# Patient Record
Sex: Male | Born: 1995 | Race: White | Hispanic: No | Marital: Single | Smoking: Never smoker
Health system: Southern US, Community
[De-identification: ages and names within clinical notes are randomized; demographics above are authoritative.]

## PROBLEM LIST (undated history)

## (undated) DIAGNOSIS — K219 Gastro-esophageal reflux disease without esophagitis: Secondary | ICD-10-CM

## (undated) DIAGNOSIS — J45909 Unspecified asthma, uncomplicated: Secondary | ICD-10-CM

## (undated) DIAGNOSIS — T7840XA Allergy, unspecified, initial encounter: Secondary | ICD-10-CM

## (undated) HISTORY — DX: Unspecified asthma, uncomplicated: J45.909

## (undated) HISTORY — DX: Allergy, unspecified, initial encounter: T78.40XA

## (undated) HISTORY — DX: Gastro-esophageal reflux disease without esophagitis: K21.9

---

## 2013-08-21 ENCOUNTER — Other Ambulatory Visit: Payer: Self-pay | Admitting: Physician Assistant

## 2013-08-21 DIAGNOSIS — E010 Iodine-deficiency related diffuse (endemic) goiter: Secondary | ICD-10-CM

## 2013-08-23 ENCOUNTER — Ambulatory Visit
Admission: RE | Admit: 2013-08-23 | Discharge: 2013-08-23 | Disposition: A | Discharge status: Home / Self Care | Payer: 59 | Source: Ambulatory Visit | Attending: Physician Assistant | Admitting: Physician Assistant

## 2013-08-23 ENCOUNTER — Encounter (INDEPENDENT_AMBULATORY_CARE_PROVIDER_SITE_OTHER): Payer: Self-pay

## 2013-08-23 DIAGNOSIS — E010 Iodine-deficiency related diffuse (endemic) goiter: Secondary | ICD-10-CM

## 2013-11-21 ENCOUNTER — Ambulatory Visit (INDEPENDENT_AMBULATORY_CARE_PROVIDER_SITE_OTHER): Payer: 59

## 2013-11-21 ENCOUNTER — Ambulatory Visit (INDEPENDENT_AMBULATORY_CARE_PROVIDER_SITE_OTHER): Payer: 59 | Admitting: Family Medicine

## 2013-11-21 VITALS — BP 118/72 | HR 63 | Temp 98.5°F | Resp 16 | Ht 76.0 in | Wt 217.8 lb

## 2013-11-21 DIAGNOSIS — M25571 Pain in right ankle and joints of right foot: Secondary | ICD-10-CM

## 2013-11-21 DIAGNOSIS — M25471 Effusion, right ankle: Secondary | ICD-10-CM

## 2013-11-21 DIAGNOSIS — T148XXA Other injury of unspecified body region, initial encounter: Secondary | ICD-10-CM

## 2013-11-21 DIAGNOSIS — T148 Other injury of unspecified body region: Secondary | ICD-10-CM

## 2013-11-21 NOTE — Patient Instructions (Addendum)

## 2013-11-21 NOTE — Progress Notes (Signed)
Chief Complaint:  Chief Complaint  Patient presents with  . Ankle Pain    right ankle pain and some swelling---x 3 weeks    HPI: Ryan Singleton is a 18 y.o. male who is here for  3 week hx of right ankle pain, he work with [employer], is up and down ladders, right ankle hurting x 3 week, more on the side. He has a rolling ankle but no prior fx or dislocations. .He has put a compression sock on it and did not help,he has elevated itt. The swelling is larger in the morning. Compressing it at night. He sits in recliners at home. No history of gout. Same boots for last  7 months ago without any issues. He has not tried any meds.   Past Medical History  Diagnosis Date  . Allergy   . Asthma   . GERD (gastroesophageal reflux disease)    No past surgical history on file. History   Social History  . Marital Status: Single    Spouse Name: N/A    Number of Children: N/A  . Years of Education: N/A   Social History Main Topics  . Smoking status: Never Smoker   . Smokeless tobacco: Never Used  . Alcohol Use: No  . Drug Use: No  . Sexual Activity: None   Other Topics Concern  . None   Social History Narrative  . None   Family History  Problem Relation Age of Onset  . Hyperlipidemia Mother   . Hyperlipidemia Father   . Hypertension Father   . Diabetes Father   . Mental retardation Sister   . Stroke Maternal Grandmother   . Cancer Maternal Grandmother   . Heart disease Maternal Grandfather   . Hyperlipidemia Maternal Grandfather   . Hypertension Maternal Grandfather   . Hypertension Paternal Grandmother   . Heart disease Paternal Grandmother   . Diabetes Paternal Grandmother   . Cancer Paternal Grandfather   . Diabetes Paternal Grandfather    Allergies  Allergen Reactions  . Penicillins     hives  . Sulfa Antibiotics     Vomiting    Prior to Admission medications   Not on File     ROS: The patient denies fevers, chills, night sweats,  unintentional weight loss, chest pain, palpitations, wheezing, dyspnea on exertion, nausea, vomiting, abdominal pain, dysuria, hematuria, melena, numbness, weakness, or tingling.  All other systems have been reviewed and were otherwise negative with the exception of those mentioned in the HPI and as above.    PHYSICAL EXAM: Filed Vitals:   11/21/13 2007  BP: 118/72  Pulse: 63  Temp: 98.5 F (36.9 C)  Resp: 16   Filed Vitals:   11/21/13 2007  Height: 6\' 4"  (1.93 m)  Weight: 217 lb 12.8 oz (98.793 kg)   Body mass index is 26.52 kg/(m^2).  General: Alert, no acute distress HEENT:  Normocephalic, atraumatic, oropharynx patent. EOMI, PERRLA Cardiovascular:  Regular rate and rhythm, no rubs murmurs or gallops.  No Carotid bruits, radial pulse intact. No pedal edema.  Respiratory: Clear to auscultation bilaterally.  No wheezes, rales, or rhonchi.  No cyanosis, no use of accessory musculature GI: No organomegaly, abdomen is soft and non-tender, positive bowel sounds.  No masses. Skin: No rashes. Neurologic: Facial musculature symmetric. Psychiatric: Patient is appropriate throughout our interaction. Lymphatic: No cervical lymphadenopathy Musculoskeletal: Gait intact. Right ankle-+ lateral malleolus swelling, full ROM, neg anterior Drawer, + tenderness anterior calcaneofibular ligametn There is  no laxity in ROM although there is full ROM.  5/5 strength, sensation intact, + DP   LABS: No results found for this or any previous visit.   EKG/XRAY:   [facility]. Neg for fx or dislocation   ASSESSMENT/PLAN: Encounter Diagnoses  Name Primary?  . Acute right ankle pain Yes  . Swelling of ankle joint, right   . Sprain and strain    Sweedo RICE ROM exercises, otc meds for pain F/u prn   Gross sideeffects, risk and benefits, and alternatives of medications d/w patient. Patient is aware that all medications have potential sideeffects and we are  unable to predict every sideeffect or drug-drug interaction that may occur.  Hamilton CapriLE, Brayley Mackowiak PHUONG, DO 11/21/2013 9:27 PM

## 2013-11-28 ENCOUNTER — Ambulatory Visit (INDEPENDENT_AMBULATORY_CARE_PROVIDER_SITE_OTHER): Payer: 59 | Admitting: Emergency Medicine

## 2013-11-28 VITALS — BP 122/76 | HR 78 | Temp 99.4°F | Resp 17 | Ht 76.5 in | Wt 221.0 lb

## 2013-11-28 DIAGNOSIS — J020 Streptococcal pharyngitis: Secondary | ICD-10-CM

## 2013-11-28 MED ORDER — CLARITHROMYCIN 250 MG PO TABS: 250.0000 mg | ORAL_TABLET | Freq: Two times a day (BID) | ORAL | Status: AC

## 2013-11-28 NOTE — Progress Notes (Signed)
[facility] [address], [place] [address] [phone]  Date:  11/28/2013   Name:  Ryan Singleton   DOB:  November 12, 1995   MRN:  [id number]  PCP:  No PCP Per Patient    Chief Complaint: Sore Throat   History of Present Illness:  Ryan Singleton is a 18 y.o. very pleasant male patient who presents with the following:  Ill two days with sore throat and swollen tonsils.  Fever. No chills  Some nasal congestion some drainage Non productive cough.  No wheezing or shortness of breath. No nausea or vomiting No improvement with over the counter medications or other home remedies.  Denies other complaint or health concern today.    There are no active problems to display for this patient.   Past Medical History  Diagnosis Date  . Allergy   . Asthma   . GERD (gastroesophageal reflux disease)     No past surgical history on file.  History  Substance Use Topics  . Smoking status: Never Smoker   . Smokeless tobacco: Never Used  . Alcohol Use: No    Family History  Problem Relation Age of Onset  . Hyperlipidemia Mother   . Hyperlipidemia Father   . Hypertension Father   . Diabetes Father   . Mental retardation Sister   . Stroke Maternal Grandmother   . Cancer Maternal Grandmother   . Heart disease Maternal Grandfather   . Hyperlipidemia Maternal Grandfather   . Hypertension Maternal Grandfather   . Hypertension Paternal Grandmother   . Heart disease Paternal Grandmother   . Diabetes Paternal Grandmother   . Cancer Paternal Grandfather   . Diabetes Paternal Grandfather     Allergies  Allergen Reactions  . Penicillins     hives  . Sulfa Antibiotics     Vomiting     Medication list has been reviewed and updated.  No current outpatient prescriptions on file prior to visit.   No current facility-administered medications on file prior to visit.    Review of Systems:  As per HPI, otherwise negative.    Physical  Examination: Filed Vitals:   11/28/13 1859  BP: 122/76  Pulse: 78  Temp: 99.4 F (37.4 C)  Resp: 17   Filed Vitals:   11/28/13 1859  Height: 6' 4.5" (1.943 m)  Weight: 221 lb (100.245 kg)   Body mass index is 26.55 kg/(m^2). Ideal Body Weight: Weight in (lb) to have BMI = 25: 207.7  GEN: WDWN, NAD, Non-toxic, A & O x 3 HEENT: Atraumatic, Normocephalic. Neck supple. No masses, No LAD.  Tonsils large and red no exudate Ears and Nose: No external deformity. CV: RRR, No M/G/R. No JVD. No thrill. No extra heart sounds. PULM: CTA B, no wheezes, crackles, rhonchi. No retractions. No resp. distress. No accessory muscle use. ABD: S, NT, ND, +BS. No rebound. No HSM. EXTR: No c/c/e NEURO Normal gait.  PSYCH: Normally interactive. Conversant. Not depressed or anxious appearing.  Calm demeanor.    Assessment and Plan: Tonsillitis Tylenol biaxin  Signed,  Phillips OdorJeffery Yves Fodor, MD

## 2013-11-28 NOTE — Patient Instructions (Signed)

## 2015-09-26 IMAGING — CR DG ANKLE COMPLETE 3+V*R*
3 series · 3 of 3 positions shown · non-contrast
Comparison: None.

CLINICAL DATA: Bilateral ankle pain for several weeks.

EXAM:
RIGHT ANKLE - COMPLETE 3+ VIEW

[AP]
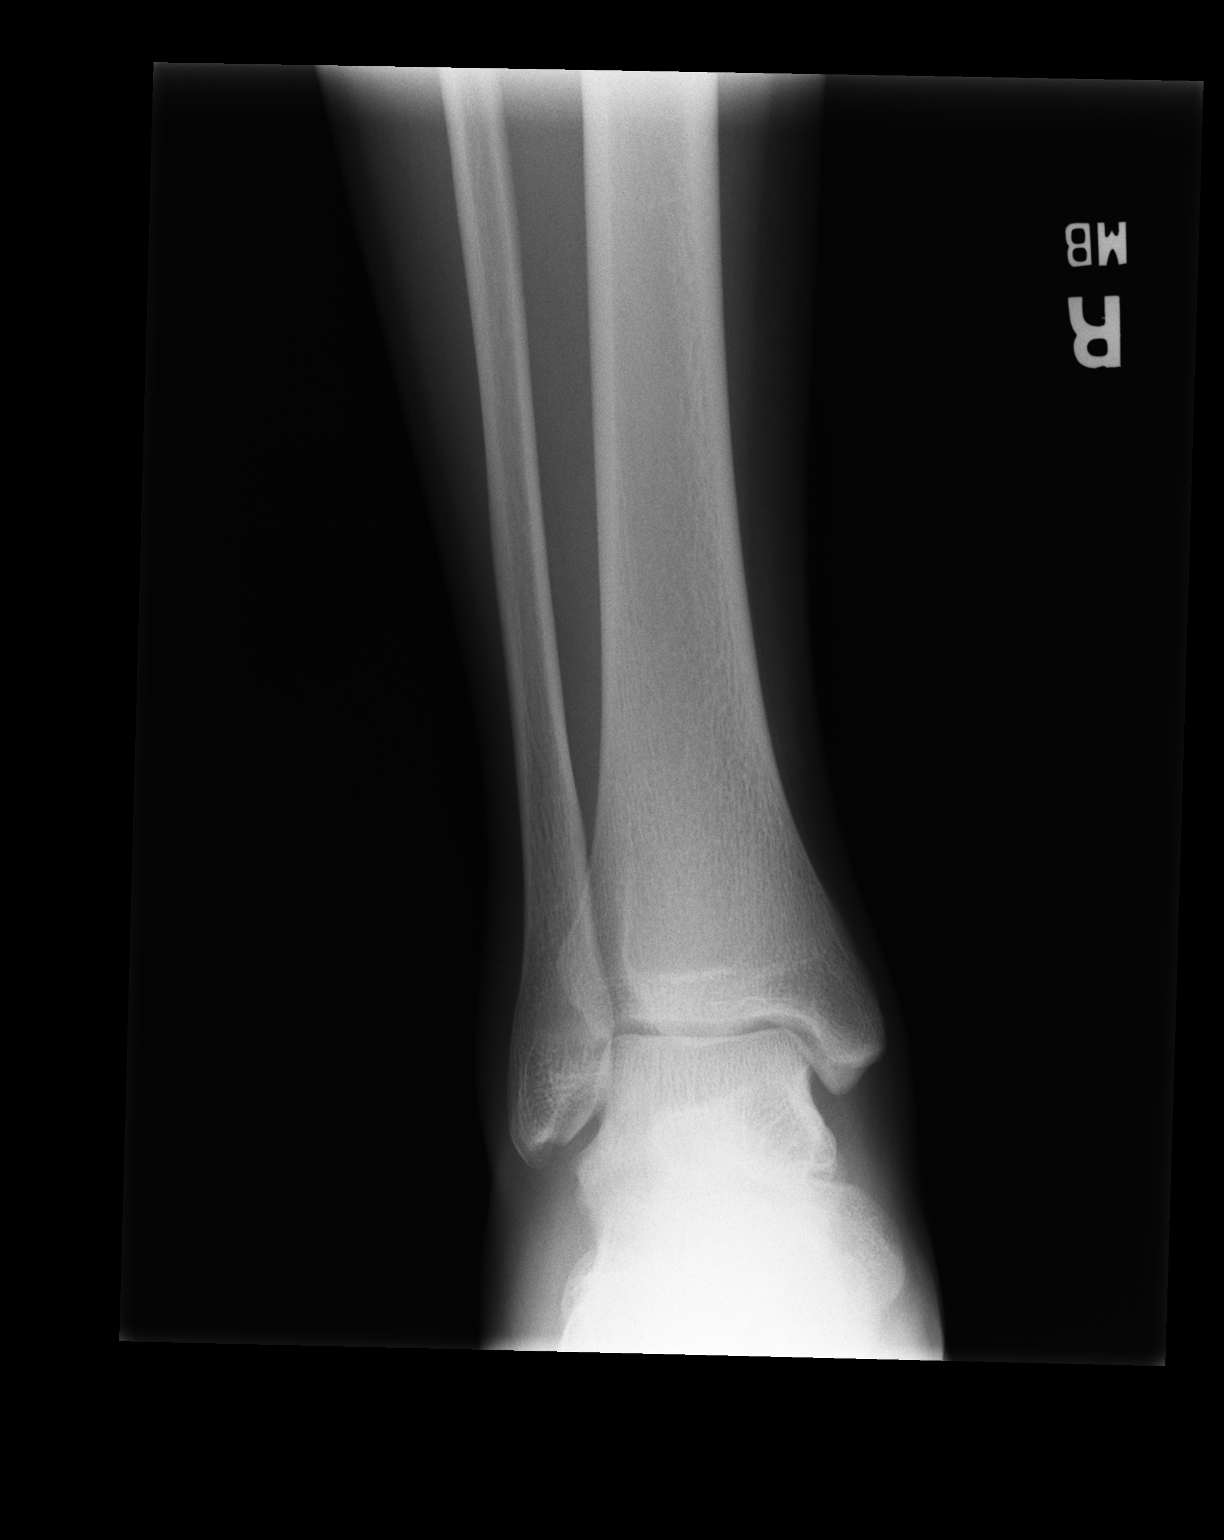

[ap obl int rot]
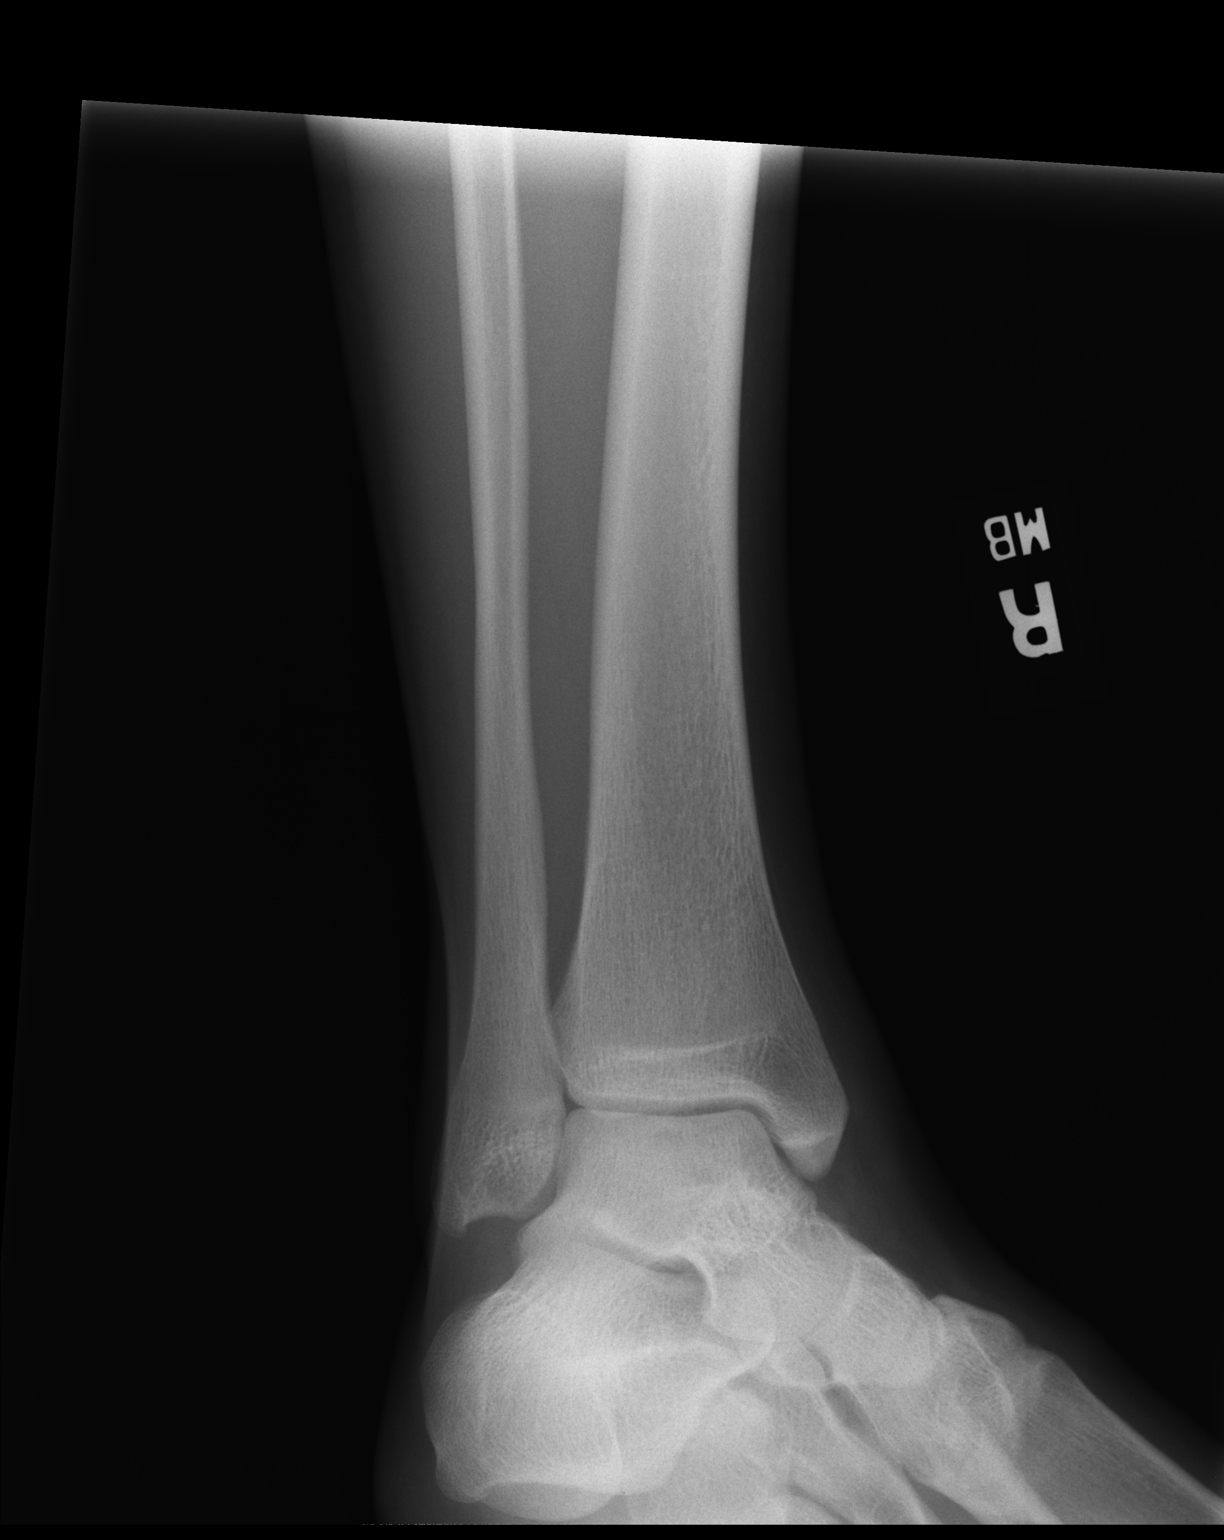

[lateral]
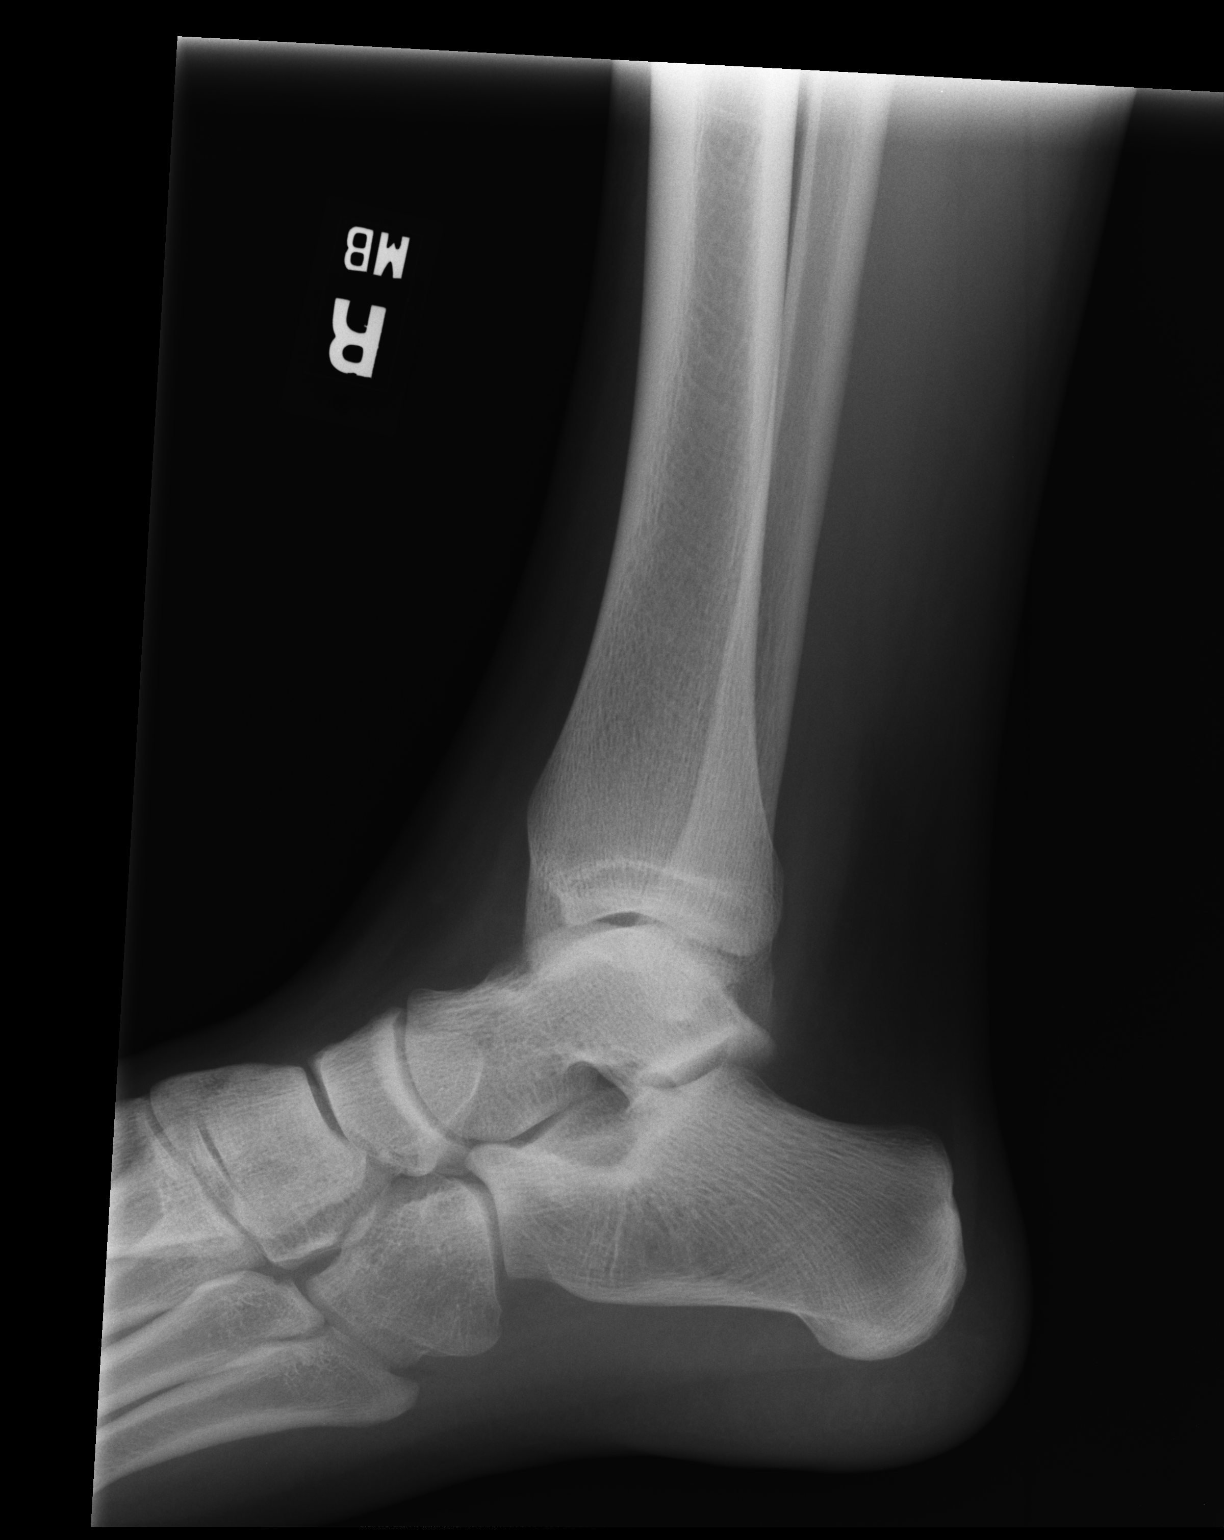

[3 of 3 positions shown; findings below may reference images not displayed]

FINDINGS: There is no evidence of fracture, dislocation, or joint effusion.
There is no evidence of arthropathy or other focal bone abnormality.
Soft tissues are unremarkable.
IMPRESSION: Negative.

## 2016-07-29 IMAGING — US THYROID
1 series · 14 of 25 positions shown · non-contrast
Comparison: None.

CLINICAL DATA: Thyromegaly

EXAM:
THYROID ULTRASOUND
TECHNIQUE: Ultrasound examination of the thyroid gland and adjacent soft
tissues was performed.

[Series 1: thyroid · 0.07mm/px · 14 of 53 slices shown]
[im 1/53]
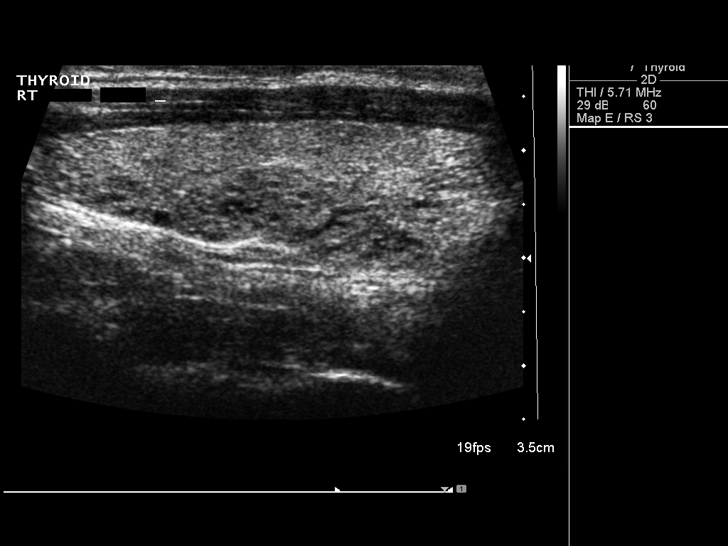
[im 5/53]
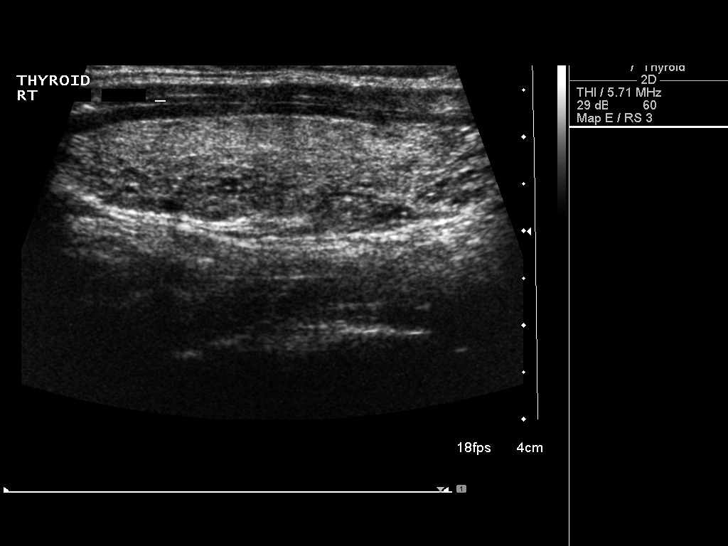
[im 9/53]
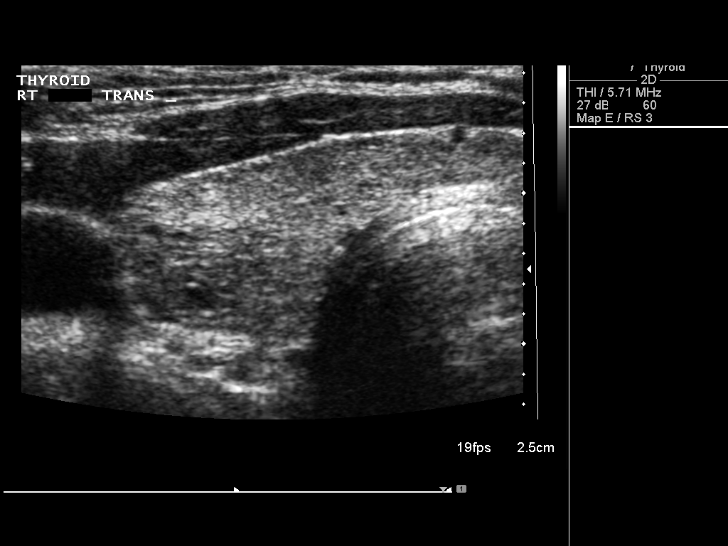
[im 14/53]
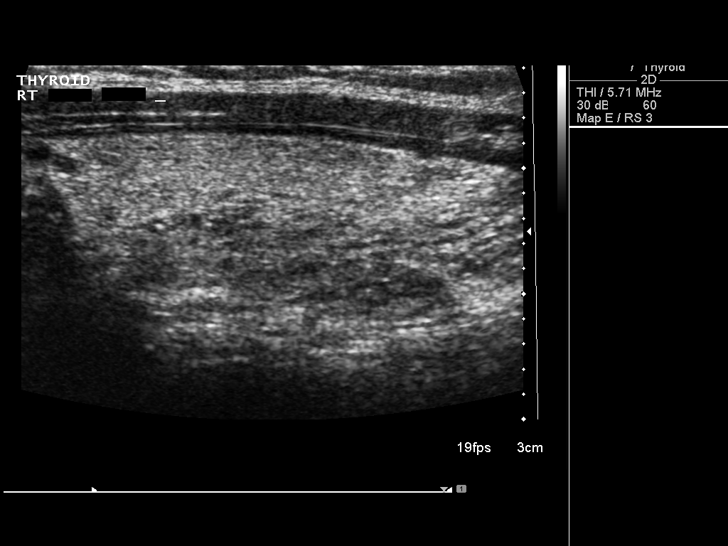
[im 18/53]
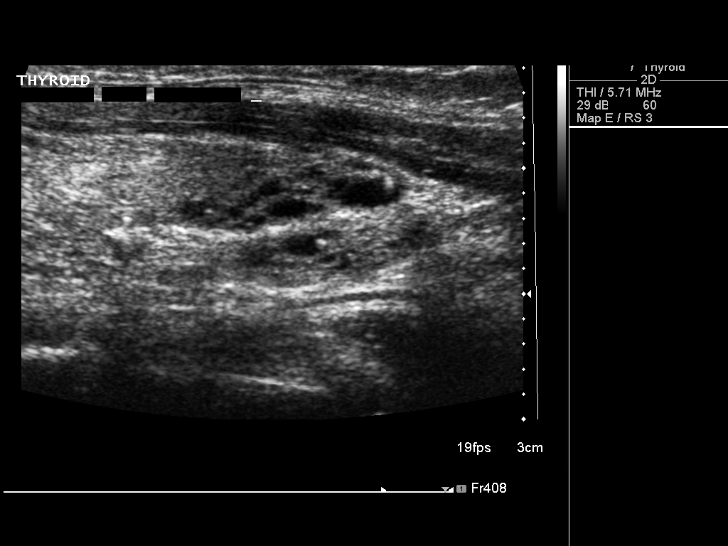
[im 20/53]
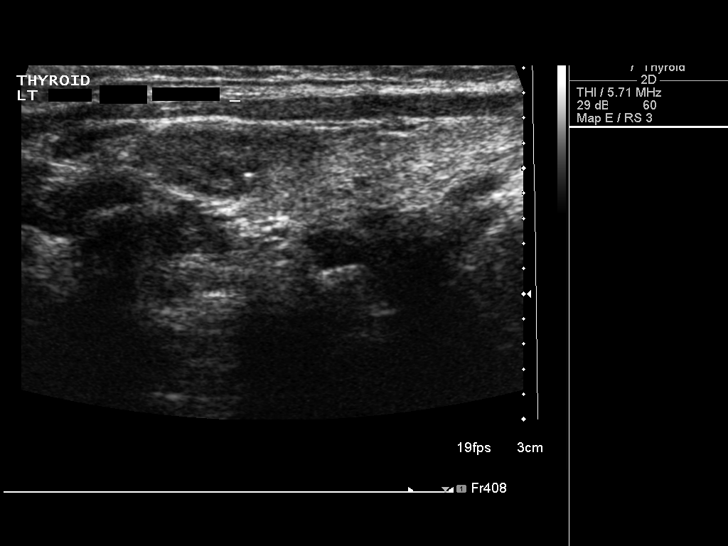
[im 24/53]
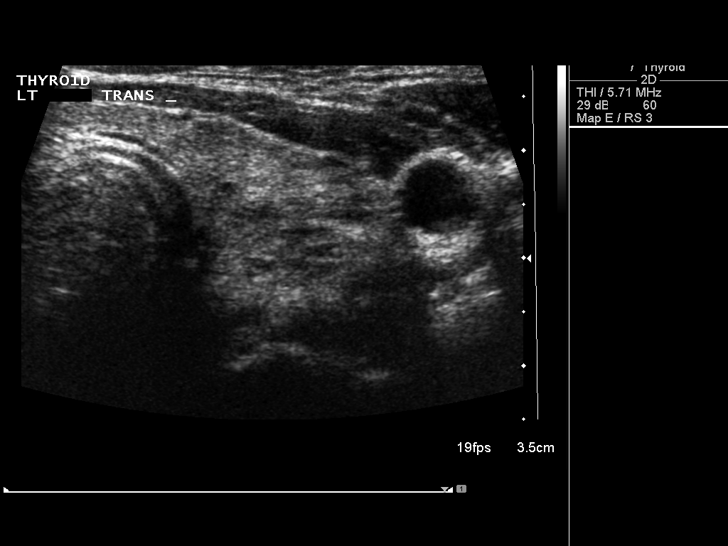
[im 29/53]
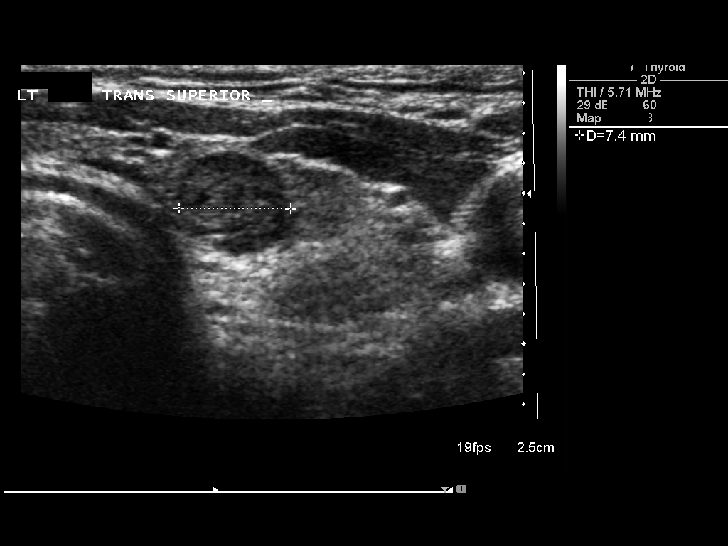
[im 33/53]
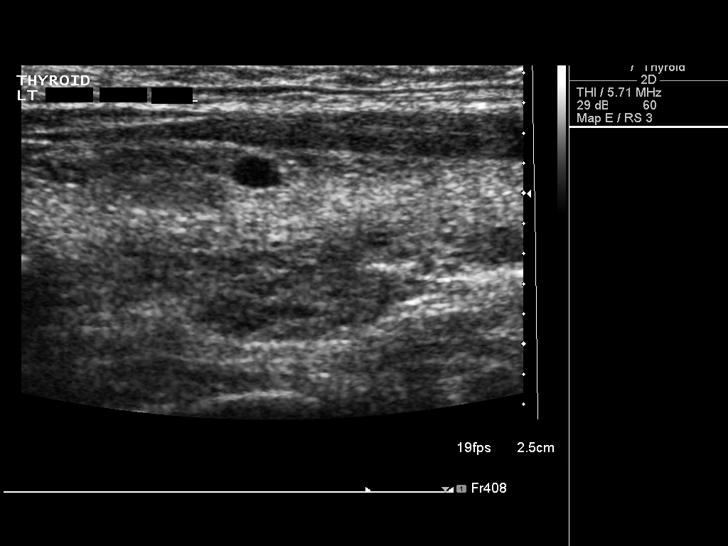
[im 35/53]
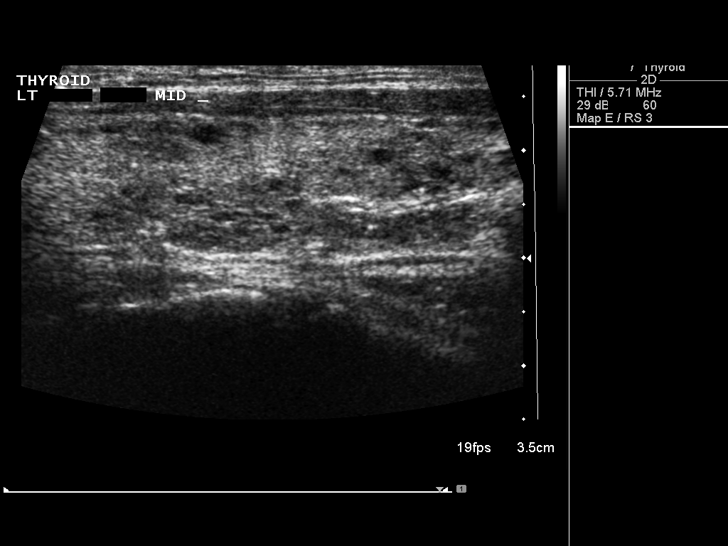
[im 40/53]
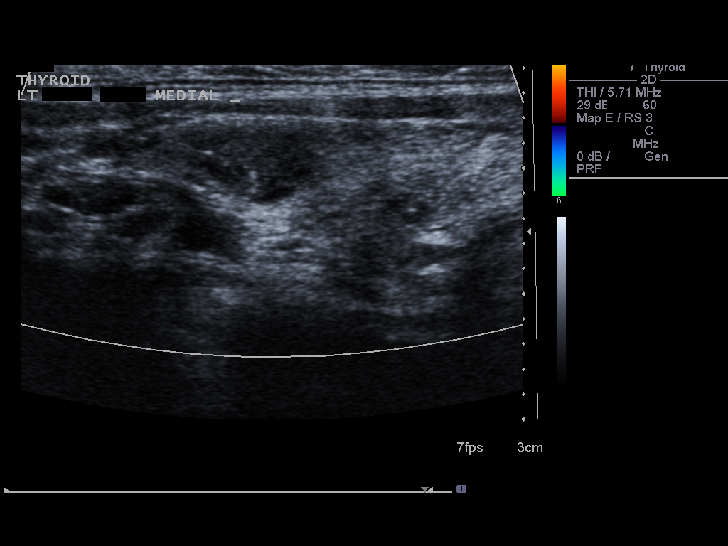
[im 44/53]
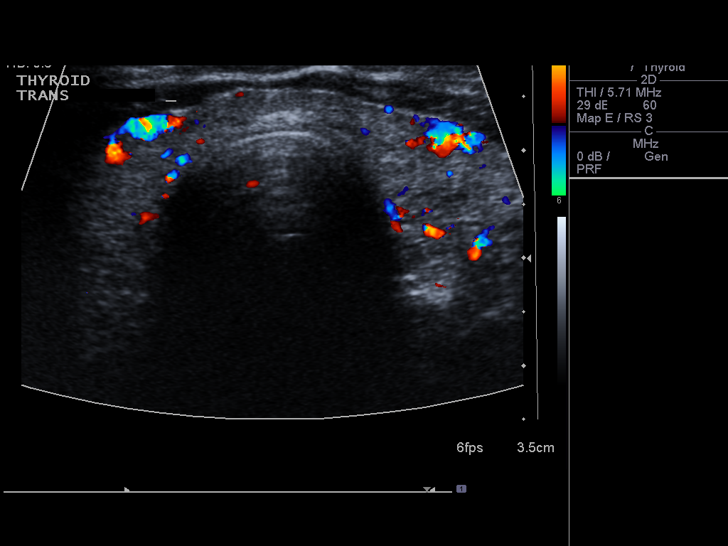
[im 48/53]
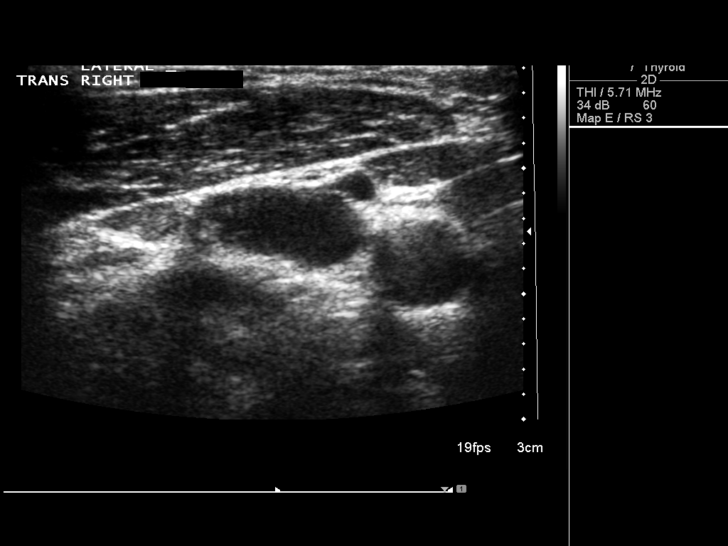
[im 53/53]
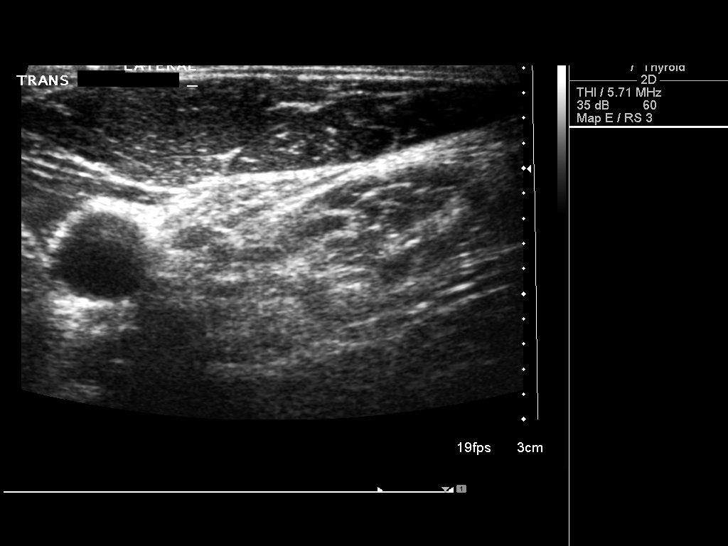

[14 of 25 positions shown; findings below may reference images not displayed]

FINDINGS: Right thyroid lobe

Measurements: 4.8 x 1.2 x 1.7 cm. Heterogeneous glandular tissue
without mass or nodule.

Left thyroid lobe

Measurements: 6.1 x 1.4 x 1.8 cm. 1.0 x 0.7 x 0.7 cm complex nodule
in the upper pole. 3 mm adjacent cysts. Glandular tissue is
heterogeneous.

Isthmus

Thickness: 2 mm.  No nodules visualized.

Lymphadenopathy

None visualized.
IMPRESSION: 1.0 cm complex nodule in the upper pole of the left lobe and an
adjacent cyst. Heterogeneous tissue. Findings do not meet current
SRU consensus criteria for biopsy. Follow-up by clinical exam is
recommended. If patient has known risk factors for thyroid
carcinoma, consider follow-up ultrasound in 12 months. If patient is
clinically hyperthyroid, consider nuclear medicine thyroid uptake
and scan.Reference: Management of Thyroid Nodules Detected at US:
Society of Radiologists in Ultrasound Consensus Conference

## 2016-10-31 ENCOUNTER — Ambulatory Visit
Admission: EM | Admit: 2016-10-31 | Discharge: 2016-10-31 | Disposition: A | Discharge status: Home / Self Care | Payer: BLUE CROSS/BLUE SHIELD | Attending: Family Medicine | Admitting: Family Medicine

## 2016-10-31 ENCOUNTER — Encounter: Payer: Self-pay | Admitting: *Deleted

## 2016-10-31 DIAGNOSIS — J40 Bronchitis, not specified as acute or chronic: Secondary | ICD-10-CM | POA: Diagnosis not present

## 2016-10-31 DIAGNOSIS — R05 Cough: Secondary | ICD-10-CM

## 2016-10-31 DIAGNOSIS — R059 Cough, unspecified: Secondary | ICD-10-CM

## 2016-10-31 MED ORDER — AZITHROMYCIN 250 MG PO TABS: ORAL_TABLET | ORAL | 0 refills | Status: AC

## 2016-10-31 MED ORDER — BENZONATATE 100 MG PO CAPS: 100.0000 mg | ORAL_CAPSULE | Freq: Three times a day (TID) | ORAL | 0 refills | Status: AC | PRN

## 2016-10-31 MED ORDER — PREDNISONE 20 MG PO TABS: 40.0000 mg | ORAL_TABLET | Freq: Every day | ORAL | 0 refills | Status: AC

## 2016-10-31 NOTE — Discharge Instructions (Signed)
Take medication as prescribed. Rest. Drink plenty of fluids.  ° °Follow up with your primary care physician this week as needed. Return to Urgent care for new or worsening concerns.  ° °

## 2016-10-31 NOTE — ED Provider Notes (Signed)
[facility] ____________________________________________  Time seen: Approximately 10:02 AM  I have reviewed the triage vital signs and the nursing notes.   HISTORY  Chief Complaint Cough   HPI Ryan Singleton is a 21 y.o. male past medical history of seasonal allergies, asthma and acid reflux presenting for evaluation of 3-4 weeks of cough and congestion. Patient reports initially symptoms started off with what he believes was a cold, as multiple coworkers with similar. States initially he had runny nose, nasal congestion, sore throat with the cough. States all her symptoms have resolved but now continues with the cough. States cough is most likely worse at night. States he has not been taking anything at home for the cough. Denies any hearing of wheezing or shortness of breath. Denies hemoptysis. States cough is nonproductive hacking cough. Patient states that he primarily came in today because his parents wanted him to make sure he was fine. Denies any recent asthma exacerbation or pneumonia infection, states this was initiated as a young child. Reports continues to eat and drink well. Reports has continued to remain active. Denies aggravating or alleviating factors. States has albuterol inhaler at home but has not used it recently. Denies chest pain, shortness of breath, abdominal pain, dysuria, extremity pain, extremity swelling or rash. Denies recent sickness. Denies recent antibiotic use.    Past Medical History:  Diagnosis Date  . Allergy   . Asthma   . GERD (gastroesophageal reflux disease)     There are no active problems to display for this patient.   History reviewed. No pertinent surgical history.   No current facility-administered medications for this encounter.   Current Outpatient Prescriptions:  .  azithromycin (ZITHROMAX Z-PAK) 250 MG tablet, Take 2 tablets (500 mg) on  Day 1,  followed by 1 tablet (250 mg) once daily on Days 2 through 5., Disp: 6  each, Rfl: 0 .  benzonatate (TESSALON PERLES) 100 MG capsule, Take 1 capsule (100 mg total) by mouth 3 (three) times daily as needed for cough., Disp: 15 capsule, Rfl: 0 .  clarithromycin (BIAXIN) 250 MG tablet, Take 1 tablet (250 mg total) by mouth 2 (two) times daily., Disp: 20 tablet, Rfl: 0 .  predniSONE (DELTASONE) 20 MG tablet, Take 2 tablets (40 mg total) by mouth daily., Disp: 10 tablet, Rfl: 0  Allergies Penicillins and Sulfa antibiotics  Family History  Problem Relation Age of Onset  . Hyperlipidemia Mother   . Hyperlipidemia Father   . Hypertension Father   . Diabetes Father   . Mental retardation Sister   . Stroke Maternal Grandmother   . Cancer Maternal Grandmother   . Heart disease Maternal Grandfather   . Hyperlipidemia Maternal Grandfather   . Hypertension Maternal Grandfather   . Hypertension Paternal Grandmother   . Heart disease Paternal Grandmother   . Diabetes Paternal Grandmother   . Cancer Paternal Grandfather   . Diabetes Paternal Grandfather     Social History Social History  Substance Use Topics  . Smoking status: Never Smoker  . Smokeless tobacco: Never Used  . Alcohol use No    Review of Systems Constitutional: No fever/chills ENT: No sore throat. Cardiovascular: Denies chest pain. Respiratory: Denies shortness of breath. Gastrointestinal: No abdominal pain.  No nausea, no vomiting.   Musculoskeletal: Negative for back pain. Skin: Negative for rash.   ____________________________________________   PHYSICAL EXAM:  VITAL SIGNS: ED Triage Vitals  Enc Vitals Group     BP 10/31/16 0940 123/89  Pulse Rate 10/31/16 0940 68     Resp 10/31/16 0940 16     Temp 10/31/16 0940 98.6 F (37 C)     Temp Source 10/31/16 0940 Oral     SpO2 10/31/16 0940 100 %     Weight 10/31/16 0943 210 lb (95.3 kg)     Height 10/31/16 0943  (1.93 m)     Head Circumference --      Peak Flow --      Pain Score --      Pain Loc --      Pain Edu? --        Excl. in GC? --    Constitutional: Alert and oriented. Well appearing and in no acute distress. Eyes: Conjunctivae are normal.  Head: Atraumatic. No sinus tenderness to palpation. No swelling. No erythema.  Ears: no erythema, normal TMs bilaterally.   Nose:No nasal congestion.   Mouth/Throat: Mucous membranes are moist. No pharyngeal erythema. No tonsillar swelling or exudate.  Neck: No stridor.  No cervical spine tenderness to palpation. Hematological/Lymphatic/Immunilogical: No cervical lymphadenopathy. Cardiovascular: Normal rate, regular rhythm. Grossly normal heart sounds.  Good peripheral circulation. Respiratory: Normal respiratory effort.  No retractions. No wheezes, rales or rhonchi. Good air movement.  Intermittent cough noted in room with bronchopsam wheeze.  Gastrointestinal: Soft and nontender. Musculoskeletal: Ambulatory with steady gait. No cervical, thoracic or lumbar tenderness to palpation. Neurologic:  Normal speech and language. No gait instability. Skin:  Skin appears warm, dry and intact. No rash noted. Psychiatric: Mood and affect are normal. Speech and behavior are normal.  ___________________________________________   LABS (all labs ordered are listed, but only abnormal results are displayed)  Labs Reviewed - No data to display  PROCEDURES Procedures   INITIAL IMPRESSION / ASSESSMENT AND PLAN / ED COURSE  Pertinent labs & imaging results that were available during my care of the patient were reviewed by me and considered in my medical decision making (see chart for details).  Well-appearing patient. No acute distress. Lungs clear throughout, mild bronchospasm noted with coughing. Suspect recent viral URI, now continued with cough due to bronchospasm. Not clear asthma exacerbation. Discussed with patient encouraged home albuterol inhaler use, prednisone and when necessary Tessalon Perles. Encouraged this supportive care. Discussed Rx hard copy given for  azithromycin if symptoms persist past 3 more days with supportive care. Encourage reevaluation for continued complaints or worsening concerns.Discussed indication, risks and benefits of medications with patient.  Discussed follow up with Primary care physician this week. Discussed follow up and return parameters including no resolution or any worsening concerns. Patient verbalized understanding and agreed to plan.   ____________________________________________   FINAL CLINICAL IMPRESSION(S) / ED DIAGNOSES  Final diagnoses:  Bronchitis  Cough     Discharge Medication List as of 10/31/2016 10:11 AM    START taking these medications   Details  azithromycin (ZITHROMAX Z-PAK) 250 MG tablet Take 2 tablets (500 mg) on  Day 1,  followed by 1 tablet (250 mg) once daily on Days 2 through 5., Print    benzonatate (TESSALON PERLES) 100 MG capsule Take 1 capsule (100 mg total) by mouth 3 (three) times daily as needed for cough., Starting Sun 10/31/2016, Normal    predniSONE (DELTASONE) 20 MG tablet Take 2 tablets (40 mg total) by mouth daily., Starting Sun 10/31/2016, Normal        Note: This dictation was prepared with Dragon dictation along with smaller phrase technology. Any transcriptional errors that result from this process  are unintentional.         Renford Dills, NP 10/31/16 1226

## 2016-10-31 NOTE — ED Triage Notes (Signed)
Non-productive cough x1 month. Cough began with a cold which resolved but cough persists. Denies other symptoms at this time.

## 2020-12-15 ENCOUNTER — Ambulatory Visit
Admission: EM | Admit: 2020-12-15 | Discharge: 2020-12-15 | Disposition: A | Discharge status: Home / Self Care | Payer: BC Managed Care – PPO

## 2020-12-15 ENCOUNTER — Other Ambulatory Visit: Payer: Self-pay

## 2020-12-15 DIAGNOSIS — J039 Acute tonsillitis, unspecified: Secondary | ICD-10-CM

## 2020-12-15 DIAGNOSIS — J069 Acute upper respiratory infection, unspecified: Secondary | ICD-10-CM

## 2020-12-15 NOTE — ED Triage Notes (Signed)
Pt c/o sore throat, cough up phelgm with blood.  Took 2 pills of z pack yesterday. None today.

## 2020-12-15 NOTE — Discharge Instructions (Addendum)
Finish the zpack til gone.

## 2020-12-15 NOTE — ED Provider Notes (Signed)
[facility]    CSN: [id number] Arrival date & time: 12/15/20  1017      History   Chief Complaint Chief Complaint  Patient presents with   Sore Throat    HPI Ryan Singleton is a 25 y.o. male who presents with ST and cough with slight tinge of blood yesterday but none since. He felt like when he has strep, so took the first dose of a Zpack he had at home and now feels fine. But his wife still wanted him to come in to be seen. Had a temp of 99.8 2 days ago.   Past Medical History:  Diagnosis Date   Allergy    Asthma    GERD (gastroesophageal reflux disease)     There are no problems to display for this patient.   No past surgical history on file.   Home Medications    Prior to Admission medications   Medication Sig Start Date End Date Taking? Authorizing Provider  azithromycin (ZITHROMAX Z-PAK) 250 MG tablet Take 2 tablets (500 mg) on  Day 1,  followed by 1 tablet (250 mg) once daily on Days 2 through 5. 10/31/16   Renford Dills, NP  benzonatate (TESSALON PERLES) 100 MG capsule Take 1 capsule (100 mg total) by mouth 3 (three) times daily as needed for cough. 10/31/16   Renford Dills, NP  clarithromycin (BIAXIN) 250 MG tablet Take 1 tablet (250 mg total) by mouth 2 (two) times daily. 11/28/13   Carmelina Dane, MD  predniSONE (DELTASONE) 20 MG tablet Take 2 tablets (40 mg total) by mouth daily. 10/31/16   Renford Dills, NP    Family History Family History  Problem Relation Age of Onset   Hyperlipidemia Mother    Hyperlipidemia Father    Hypertension Father    Diabetes Father    Mental retardation Sister    Stroke Maternal Grandmother    Cancer Maternal Grandmother    Heart disease Maternal Grandfather    Hyperlipidemia Maternal Grandfather    Hypertension Maternal Grandfather    Hypertension Paternal Grandmother    Heart disease Paternal Grandmother    Diabetes Paternal Grandmother    Cancer Paternal Grandfather    Diabetes Paternal  Grandfather     Social History Social History   Tobacco Use   Smoking status: Never   Smokeless tobacco: Never  Substance Use Topics   Alcohol use: No   Drug use: No     Allergies   Penicillins and Sulfa antibiotics   Review of Systems Review of Systems  Constitutional:  Positive for chills, fatigue and fever. Negative for activity change, appetite change and diaphoresis.  HENT:  Positive for postnasal drip and sore throat. Negative for ear discharge, ear pain and trouble swallowing.   Eyes:  Negative for discharge.  Respiratory:  Positive for cough.   Musculoskeletal:  Negative for myalgias.  Skin:  Negative for rash.  Neurological:  Negative for headaches.    Physical Exam Triage Vital Signs ED Triage Vitals  Enc Vitals Group     BP 12/15/20 1059 140/90     Pulse Rate 12/15/20 1059 72     Resp 12/15/20 1059 16     Temp 12/15/20 1059 98.3 F (36.8 C)     Temp Source 12/15/20 1059 Oral     SpO2 12/15/20 1059 100 %     Weight 12/15/20 1058 210 lb 1.6 oz (95.3 kg)     Height 12/15/20 1058 6\' 4"  (1.93 m)  Head Circumference --      Peak Flow --      Pain Score 12/15/20 1057 0     Pain Loc --      Pain Edu? --      Excl. in GC? --    No data found.  Updated Vital Signs BP 140/90 (BP Location: Left Arm)   Pulse 72   Temp 98.3 F (36.8 C) (Oral)   Resp 16   Ht 6\' 4"  (1.93 m)   Wt 210 lb 1.6 oz (95.3 kg)   SpO2 100%   BMI 25.57 kg/m   Visual Acuity Right Eye Distance:   Left Eye Distance:   Bilateral Distance:    Right Eye Near:   Left Eye Near:    Bilateral Near:     Physical Exam Vitals and nursing note reviewed.  Constitutional:      General: He is not in acute distress.    Appearance: He is normal weight. He is not toxic-appearing.  HENT:     Head: Normocephalic.     Mouth/Throat:     Mouth: Mucous membranes are moist. No oral lesions.     Pharynx: Uvula midline. Posterior oropharyngeal erythema present. No oropharyngeal exudate.      Tonsils: No tonsillar exudate. 2+ on the right. 2+ on the left.  Eyes:     Conjunctiva/sclera: Conjunctivae normal.     Pupils: Pupils are equal, round, and reactive to light.  Cardiovascular:     Rate and Rhythm: Normal rate and regular rhythm.     Heart sounds: No murmur heard. Pulmonary:     Effort: Pulmonary effort is normal.     Breath sounds: Normal breath sounds.  Musculoskeletal:     Cervical back: Neck supple.  Skin:    General: Skin is warm and dry.  Neurological:     Mental Status: He is alert and oriented to person, place, and time.  Psychiatric:        Mood and Affect: Mood normal.        Behavior: Behavior normal.     UC Treatments / Results  Labs (all labs ordered are listed, but only abnormal results are displayed) Labs Reviewed - No data to display  EKG   Radiology No results found.  Procedures Procedures (including critical care time)  Medications Ordered in UC Medications - No data to display  Initial Impression / Assessment and Plan / UC Course  I have reviewed the triage vital signs and the nursing notes. Tonsillitis I advised him to finish the Zpack.  Final Clinical Impressions(s) / UC Diagnoses   Final diagnoses:  Acute tonsillitis, unspecified etiology  Acute upper respiratory infection     Discharge Instructions      Finish the zpack til gone.      ED Prescriptions   None    PDMP not reviewed this encounter.   , Garey Ham 12/15/20 1129

## 2022-11-01 ENCOUNTER — Ambulatory Visit
Admission: EM | Admit: 2022-11-01 | Discharge: 2022-11-01 | Disposition: A | Discharge status: Home / Self Care | Payer: BC Managed Care – PPO | Attending: Physician Assistant | Admitting: Physician Assistant

## 2022-11-01 DIAGNOSIS — R051 Acute cough: Secondary | ICD-10-CM

## 2022-11-01 DIAGNOSIS — J069 Acute upper respiratory infection, unspecified: Secondary | ICD-10-CM | POA: Diagnosis present

## 2022-11-01 DIAGNOSIS — Z1152 Encounter for screening for COVID-19: Secondary | ICD-10-CM | POA: Diagnosis not present

## 2022-11-01 DIAGNOSIS — B9789 Other viral agents as the cause of diseases classified elsewhere: Secondary | ICD-10-CM | POA: Insufficient documentation

## 2022-11-01 DIAGNOSIS — R0981 Nasal congestion: Secondary | ICD-10-CM | POA: Diagnosis not present

## 2022-11-01 DIAGNOSIS — J029 Acute pharyngitis, unspecified: Secondary | ICD-10-CM

## 2022-11-01 LAB — SARS CORONAVIRUS 2 BY RT PCR: SARS Coronavirus 2 by RT PCR: NEGATIVE

## 2022-11-01 LAB — GROUP A STREP BY PCR: Group A Strep by PCR: NOT DETECTED

## 2022-11-01 MED ORDER — PROMETHAZINE-DM 6.25-15 MG/5ML PO SYRP: 5.0000 mL | ORAL_SOLUTION | Freq: Four times a day (QID) | ORAL | 0 refills | Status: AC | PRN

## 2022-11-01 NOTE — ED Triage Notes (Signed)
Patient states that he has a sore throat and sinus drainage x 2 days.

## 2022-11-01 NOTE — ED Provider Notes (Signed)
[facility]    CSN: [id number] Arrival date & time: 11/01/22  6295      History   Chief Complaint Chief Complaint  Patient presents with   Sore Throat    HPI Ryan Singleton is a 27 y.o. male presenting for sore throat, congestion and cough x 2 days.  Reports having bodyaches which have gone away.  No fever, fatigue, chest pain, shortness of breath, wheezing, vomiting or diarrhea.  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  Denies any other sick contacts in his household.  He has taken Mucinex yesterday.  Sore throat has improved today.  History of asthma and allergies.  HPI  Past Medical History:  Diagnosis Date   Allergy    Asthma    GERD (gastroesophageal reflux disease)     There are no problems to display for this patient.   History reviewed. No pertinent surgical history.     Home Medications    Prior to Admission medications   Medication Sig Start Date End Date Taking? Authorizing Provider  JORNAY PM 100 MG CP24 Take 1 capsule by mouth at bedtime. 10/08/22  Yes [provider]  promethazine-dextromethorphan (PROMETHAZINE-DM) 6.25-15 MG/5ML syrup Take 5 mLs by mouth 4 (four) times daily as needed. 11/01/22  Yes Eusebio Friendly B, PA-C  azithromycin (ZITHROMAX Z-PAK) 250 MG tablet Take 2 tablets (500 mg) on  Day 1,  followed by 1 tablet (250 mg) once daily on Days 2 through 5. 10/31/16   Renford Dills, NP  benzonatate (TESSALON PERLES) 100 MG capsule Take 1 capsule (100 mg total) by mouth 3 (three) times daily as needed for cough. 10/31/16   Renford Dills, NP  clarithromycin (BIAXIN) 250 MG tablet Take 1 tablet (250 mg total) by mouth 2 (two) times daily. 11/28/13   Carmelina Dane, MD  predniSONE (DELTASONE) 20 MG tablet Take 2 tablets (40 mg total) by mouth daily. 10/31/16   Renford Dills, NP    Family History Family History  Problem Relation Age of Onset    Hyperlipidemia Mother    Hyperlipidemia Father    Hypertension Father    Diabetes Father    Mental retardation Sister    Stroke Maternal Grandmother    Cancer Maternal Grandmother    Heart disease Maternal Grandfather    Hyperlipidemia Maternal Grandfather    Hypertension Maternal Grandfather    Hypertension Paternal Grandmother    Heart disease Paternal Grandmother    Diabetes Paternal Grandmother    Cancer Paternal Grandfather    Diabetes Paternal Grandfather     Social History Social History   Tobacco Use   Smoking status: Never   Smokeless tobacco: Never  Vaping Use   Vaping status: Never Used  Substance Use Topics   Alcohol use: No   Drug use: No     Allergies   Penicillins and Sulfa antibiotics   Review of Systems Review of Systems  Constitutional:  Negative for fatigue and fever.  HENT:  Positive for congestion, rhinorrhea and sore throat. Negative for sinus pressure and sinus pain.   Respiratory:  Positive for cough. Negative for shortness of breath.   Gastrointestinal:  Negative for abdominal pain, diarrhea, nausea and vomiting.  Musculoskeletal:  Negative for myalgias.  Neurological:  Negative for weakness, light-headedness and headaches.  Hematological:  Negative for adenopathy.     Physical Exam Triage Vital Signs ED Triage Vitals  Encounter Vitals Group  BP 11/01/22 0829 124/82     Systolic BP Percentile --      Diastolic BP Percentile --      Pulse Rate 11/01/22 0829 69     Resp 11/01/22 0829 18     Temp 11/01/22 0829 98.7 F (37.1 C)     Temp Source 11/01/22 0829 Oral     SpO2 11/01/22 0829 100 %     Weight --      Height --      Head Circumference --      Peak Flow --      Pain Score 11/01/22 0827 0     Pain Loc --      Pain Education --      Exclude from Growth Chart --    No data found.  Updated Vital Signs BP 124/82 (BP Location: Left Arm)   Pulse 69   Temp 98.7 F (37.1 C) (Oral)   Resp 18   SpO2 100%    Physical  Exam Vitals and nursing note reviewed.  Constitutional:      General: He is not in acute distress.    Appearance: He is well-developed. He is not ill-appearing.  HENT:     Head: Normocephalic and atraumatic.     Nose: Congestion and rhinorrhea present.     Mouth/Throat:     Mouth: Mucous membranes are moist.     Pharynx: Oropharynx is clear. Posterior oropharyngeal erythema present.  Eyes:     General: No scleral icterus.    Conjunctiva/sclera: Conjunctivae normal.  Cardiovascular:     Rate and Rhythm: Normal rate and regular rhythm.     Heart sounds: Normal heart sounds.  Pulmonary:     Effort: Pulmonary effort is normal. No respiratory distress.     Breath sounds: Normal breath sounds.  Musculoskeletal:     Cervical back: Neck supple.  Skin:    General: Skin is warm and dry.     Capillary Refill: Capillary refill takes less than 2 seconds.  Neurological:     General: No focal deficit present.     Mental Status: He is alert. Mental status is at baseline.     Motor: No weakness.     Gait: Gait normal.  Psychiatric:        Mood and Affect: Mood normal.        Behavior: Behavior normal.      UC Treatments / Results  Labs (all labs ordered are listed, but only abnormal results are displayed) Labs Reviewed  GROUP A STREP BY PCR  SARS CORONAVIRUS 2 BY RT PCR    EKG   Radiology No results found.  Procedures Procedures (including critical care time)  Medications Ordered in UC Medications - No data to display  Initial Impression / Assessment and Plan / UC Course  I have reviewed the triage vital signs and the nursing notes.  Pertinent labs & imaging results that were available during my care of the patient were reviewed by me and considered in my medical decision making (see chart for details).   27 year old male presents for sore throat, cough and congestion x 2 days.  No fever, fatigue, aches.  Vitals are normal and stable.  Patient is overall well-appearing.   Has nasal congestion with slight yellowish drainage, erythema posterior pharynx.  Chest clear.  Strep and COVID testing obtained. All negative.   Viral URI. Supportive care. Sent promethazine DM. Reviewed return precautions.   Final Clinical Impressions(s) / UC Diagnoses  Final diagnoses:  Viral upper respiratory tract infection  Sore throat  Nasal congestion  Acute cough     Discharge Instructions      URI/COLD SYMPTOMS: Your exam today is consistent with a viral illness. Antibiotics are not indicated at this time. Use medications as directed, including cough syrup, nasal saline, and decongestants. Your symptoms should improve over the next few days and resolve within 7-10 days. Increase rest and fluids. F/u if symptoms worsen or predominate such as sore throat, ear pain, productive cough, shortness of breath, or if you develop high fevers or worsening fatigue over the next several days.       ED Prescriptions     Medication Sig Dispense Auth. Provider   promethazine-dextromethorphan (PROMETHAZINE-DM) 6.25-15 MG/5ML syrup Take 5 mLs by mouth 4 (four) times daily as needed. 118 mL Shirlee Latch, PA-C      PDMP not reviewed this encounter.   Shirlee Latch, PA-C 11/01/22 1008

## 2022-11-01 NOTE — Discharge Instructions (Signed)
URI/COLD SYMPTOMS: Your exam today is consistent with a viral illness. Antibiotics are not indicated at this time. Use medications as directed, including cough syrup, nasal saline, and decongestants. Your symptoms should improve over the next few days and resolve within 7-10 days. Increase rest and fluids. F/u if symptoms worsen or predominate such as sore throat, ear pain, productive cough, shortness of breath, or if you develop high fevers or worsening fatigue over the next several days.
